# Patient Record
Sex: Female | Born: 1955 | Race: Black or African American | Hispanic: No | Smoking: Never smoker
Health system: Southern US, Community
[De-identification: ages and names within clinical notes are randomized; demographics above are authoritative.]

## PROBLEM LIST (undated history)

## (undated) DIAGNOSIS — J45909 Unspecified asthma, uncomplicated: Secondary | ICD-10-CM

## (undated) DIAGNOSIS — R159 Full incontinence of feces: Secondary | ICD-10-CM

## (undated) DIAGNOSIS — R55 Syncope and collapse: Secondary | ICD-10-CM

## (undated) DIAGNOSIS — R32 Unspecified urinary incontinence: Secondary | ICD-10-CM

## (undated) HISTORY — DX: Syncope and collapse: R55

## (undated) HISTORY — DX: Full incontinence of feces: R15.9

## (undated) HISTORY — DX: Unspecified asthma, uncomplicated: J45.909

## (undated) HISTORY — PX: TOTAL ABDOMINAL HYSTERECTOMY W/ BILATERAL SALPINGOOPHORECTOMY: SHX83

## (undated) HISTORY — PX: BREAST BIOPSY: SHX20

## (undated) HISTORY — PX: BREAST EXCISIONAL BIOPSY: SUR124

## (undated) HISTORY — DX: Unspecified urinary incontinence: R32

---

## 1999-08-31 ENCOUNTER — Encounter: Admission: RE | Admit: 1999-08-31 | Discharge: 1999-08-31 | Payer: Self-pay | Admitting: Gynecology

## 1999-08-31 ENCOUNTER — Encounter: Payer: Self-pay | Admitting: Gynecology

## 1999-09-05 ENCOUNTER — Other Ambulatory Visit: Admission: RE | Admit: 1999-09-05 | Discharge: 1999-09-05 | Payer: Self-pay | Admitting: Gynecology

## 2000-09-05 ENCOUNTER — Encounter: Payer: Self-pay | Admitting: Gynecology

## 2000-09-05 ENCOUNTER — Other Ambulatory Visit: Admission: RE | Admit: 2000-09-05 | Discharge: 2000-09-05 | Payer: Self-pay | Admitting: Gynecology

## 2000-09-05 ENCOUNTER — Encounter: Admission: RE | Admit: 2000-09-05 | Discharge: 2000-09-05 | Payer: Self-pay | Admitting: Gynecology

## 2001-09-09 ENCOUNTER — Encounter: Admission: RE | Admit: 2001-09-09 | Discharge: 2001-09-09 | Payer: Self-pay | Admitting: Gynecology

## 2001-09-09 ENCOUNTER — Encounter: Payer: Self-pay | Admitting: Gynecology

## 2001-09-20 ENCOUNTER — Other Ambulatory Visit: Admission: RE | Admit: 2001-09-20 | Discharge: 2001-09-20 | Payer: Self-pay | Admitting: Gynecology

## 2002-09-15 ENCOUNTER — Encounter: Payer: Self-pay | Admitting: Gynecology

## 2002-09-15 ENCOUNTER — Encounter: Admission: RE | Admit: 2002-09-15 | Discharge: 2002-09-15 | Payer: Self-pay | Admitting: Gynecology

## 2002-10-06 ENCOUNTER — Encounter: Payer: Self-pay | Admitting: Gynecology

## 2002-10-06 ENCOUNTER — Encounter: Admission: RE | Admit: 2002-10-06 | Discharge: 2002-10-06 | Payer: Self-pay | Admitting: Gynecology

## 2002-12-10 ENCOUNTER — Other Ambulatory Visit: Admission: RE | Admit: 2002-12-10 | Discharge: 2002-12-10 | Payer: Self-pay | Admitting: Gynecology

## 2003-09-18 ENCOUNTER — Encounter: Admission: RE | Admit: 2003-09-18 | Discharge: 2003-09-18 | Payer: Self-pay | Admitting: Gynecology

## 2004-09-13 ENCOUNTER — Ambulatory Visit: Payer: Self-pay | Admitting: Internal Medicine

## 2004-10-06 ENCOUNTER — Ambulatory Visit (HOSPITAL_COMMUNITY): Admission: RE | Admit: 2004-10-06 | Discharge: 2004-10-06 | Payer: Self-pay | Admitting: Internal Medicine

## 2005-06-05 ENCOUNTER — Ambulatory Visit: Payer: Self-pay | Admitting: Internal Medicine

## 2005-10-11 ENCOUNTER — Ambulatory Visit (HOSPITAL_COMMUNITY): Admission: RE | Admit: 2005-10-11 | Discharge: 2005-10-11 | Payer: Self-pay | Admitting: Internal Medicine

## 2006-02-01 ENCOUNTER — Encounter: Admission: RE | Admit: 2006-02-01 | Discharge: 2006-02-01 | Payer: Self-pay | Admitting: Internal Medicine

## 2006-08-02 ENCOUNTER — Ambulatory Visit: Payer: Self-pay | Admitting: Internal Medicine

## 2006-10-30 ENCOUNTER — Encounter: Admission: RE | Admit: 2006-10-30 | Discharge: 2006-10-30 | Payer: Self-pay | Admitting: Surgery

## 2007-11-01 ENCOUNTER — Encounter: Admission: RE | Admit: 2007-11-01 | Discharge: 2007-11-01 | Payer: Self-pay | Admitting: Family Medicine

## 2007-12-04 ENCOUNTER — Telehealth (INDEPENDENT_AMBULATORY_CARE_PROVIDER_SITE_OTHER): Payer: Self-pay | Admitting: *Deleted

## 2007-12-05 DIAGNOSIS — J45909 Unspecified asthma, uncomplicated: Secondary | ICD-10-CM

## 2007-12-06 ENCOUNTER — Ambulatory Visit: Payer: Self-pay | Admitting: Internal Medicine

## 2007-12-16 ENCOUNTER — Telehealth: Payer: Self-pay | Admitting: Internal Medicine

## 2008-03-16 ENCOUNTER — Encounter: Admission: RE | Admit: 2008-03-16 | Discharge: 2008-03-16 | Payer: Self-pay | Admitting: Family Medicine

## 2008-11-02 ENCOUNTER — Encounter: Admission: RE | Admit: 2008-11-02 | Discharge: 2008-11-02 | Payer: Self-pay | Admitting: Family Medicine

## 2008-11-17 ENCOUNTER — Encounter: Admission: RE | Admit: 2008-11-17 | Discharge: 2008-11-17 | Payer: Self-pay | Admitting: Family Medicine

## 2009-01-13 ENCOUNTER — Ambulatory Visit: Payer: Self-pay | Admitting: Internal Medicine

## 2009-01-13 ENCOUNTER — Encounter: Payer: Self-pay | Admitting: Internal Medicine

## 2009-12-07 ENCOUNTER — Ambulatory Visit: Payer: Self-pay | Admitting: Internal Medicine

## 2009-12-07 ENCOUNTER — Telehealth (INDEPENDENT_AMBULATORY_CARE_PROVIDER_SITE_OTHER): Payer: Self-pay | Admitting: *Deleted

## 2009-12-07 DIAGNOSIS — J069 Acute upper respiratory infection, unspecified: Secondary | ICD-10-CM | POA: Insufficient documentation

## 2009-12-08 ENCOUNTER — Telehealth (INDEPENDENT_AMBULATORY_CARE_PROVIDER_SITE_OTHER): Payer: Self-pay | Admitting: *Deleted

## 2009-12-14 ENCOUNTER — Encounter: Admission: RE | Admit: 2009-12-14 | Discharge: 2009-12-14 | Payer: Self-pay | Admitting: Family Medicine

## 2010-10-30 ENCOUNTER — Encounter: Payer: Self-pay | Admitting: Family Medicine

## 2010-10-30 ENCOUNTER — Encounter: Payer: Self-pay | Admitting: Surgery

## 2010-11-08 NOTE — Progress Notes (Signed)
Summary: prescriptions  Phone Note From Pharmacy Call back at [phone]   Caller: [facility] Call For: wert  Summary of Call: pt said Dr. Sherene Sires gave her rx for prednisone and an inhaler, pharmacy doesn't have those prescriptions. Initial call taken by: Darletta Moll,  December 07, 2009 1:12 PM  Follow-up for Phone Call        pt called back, MW did not call in her Prednisone Taper or her refill on Proair inhaler.  Would like this called in to  [facility] [address]  Pt lives in [place]  and leaves area at 5:00pm.  Need this called in asap so they will have it ready by 5. Follow-up by: Eugene Gavia,  December 07, 2009 1:25 PM  Additional Follow-up for Phone Call Additional follow up Details #1::         pt saw MW  this morning.  per pt instructions, pt was to be given a prednisone taper.  rx never called into pharmacy.  Need prednisone mg.  Per TD,  Prednisone should be 10mg .  rx sent to pharamcy.  LMOM informing pt. Arman Filter LPN  December 08, 4538 2:09 PM     New/Updated Medications: PREDNISONE 10 MG TABS (PREDNISONE) take 4 tabs by mouth x 2 days, then 2 tabs by mouth x 2 days, then 1 tab by mouth x 2 days then stop Prescriptions: PROAIR HFA 108 (90 BASE) MCG/ACT  AERS (ALBUTEROL SULFATE) 1-2 puffs every 4-6 hours as needed  #1 x 2   Entered by:   Arman Filter LPN   Authorized by:   Nyoka Cowden MD   Signed by:   Arman Filter LPN on 98/08/9146   Method used:   Electronically to        [facility] [address] [phone]* (retail)       [address]       [place], [place]  [address]       Ph: [phone] or [phone]       Fax: [phone]   RxID:   [id number] PREDNISONE 10 MG TABS (PREDNISONE) take 4 tabs by mouth x 2 days, then 2 tabs by mouth x 2 days, then 1 tab by mouth x 2 days then stop  #14 x 0   Entered by:   Arman Filter LPN   Authorized by:   Nyoka Cowden MD   Signed by:   Arman Filter LPN on 43/32/9518   Method used:   Electronically to          [facility] [address] [phone]* (retail)       [address]       [place], [place]  [address]       Ph: [phone] or [phone]       Fax: [phone]   RxID:   [id number]

## 2010-11-08 NOTE — Assessment & Plan Note (Signed)
Summary: Pulmononary/ uri ? sinusitis ? early bronchopna   Primary Provider/Referring Provider:  [facility]  CC:  Acute viist.  Pt states that she has been sick since Christmas 2010.  She c/o cough and congestion- cough prod with green to clear sputum.  She has also and fever/chills and weakness.  Flu and strep were both neg per her PCP.  Katie Colon  History of Present Illness: 55 yowbf  never smoker characterized as mild to moderate chronic asthma associated with chronic rhinitis but remained completely asymptomatic for  on the combination of Singulair one daily and Nasarel previously.  3/09 insurance requested substitute nasonex.  Caused nose bleeding w/in one week so d/c'd.  January 13, 2009 ov patient expressed her frustration that she was not offered samples rather than a prescription that she had to pay for 3 full months that she could not use but has actually done fairly well over the last year on Singulair as monotherapy with no significant flares of either asthma rhinitis or sinus disease. She does notice shortness of breath on exertion and occasional cough at night but says she has learned to live with it.  rec no change in rx.  Before December 2010 HA then dry throat/ coughing up green fever chills weakness self rx with ibuprofen, gradually better, then worse then to PCP dx with uri rx mucinex better except no energy and still with thick green mucus then acutely worse 2/25 with same problems with generalized cp ant with coughing.   Pt denies any significant sore throat, dysphagia, itching, sneezing,  shaking chills, sweats, unintended wt loss, pleuritic or exertional cp, hempoptysis, change in activity tolerance orthopnea pnd or leg swelling.  Pt also denies any obvious fluctuation in symptoms with weather or environmental change or other alleviating or aggravating factors.       Current Medications (verified): 1)  Singulair 10 Mg  Tabs (Montelukast Sodium) .... Once Daily in Evening 2)   Femring 0.1 Mg/24hr  Ring (Estradiol Acetate) .... As Directed 3)  Proair Hfa 108 (90 Base) Mcg/act  Aers (Albuterol Sulfate) .Katie Colon.. 1-2 Puffs Every 4-6 Hours As Needed 4)  Calcium 600 1500 Mg Tabs (Calcium Carbonate) .... 2 Once Daily  Allergies (verified): 1)  ! Codeine  Past History:  Past Medical History: Chronic Asthma/rhinitis    Vital Signs:  Patient profile:   55 year old female Weight:      168 pounds BMI:     24.90 O2 Sat:      97 % on Room air Temp:     99.0 degrees F oral Pulse rate:   79 / minute BP sitting:   140 / 86  (left arm)  Vitals Entered By: Vernie Murders (December 07, 2009 9:58 AM)  O2 Flow:  Room air  Physical Exam  Additional Exam:  Ambulatory healthy appearing in no acute distress.  wt 171 > 168 December 07, 2009  HEENT: nl dentition, turbinates, and orophanx. Nl external ear canals without cough reflex Neck without JVD/Nodes/TM Lungs clear to A and P bilaterally without cough on insp or exp maneuvers, a few squeaks in LLL RRR no s3 or murmur or increase in P2 Abd soft and benign with nl excursion in the supine position. No bruits or organomegaly Ext warm without calf tenderness, cyanosis clubbing or edema Skin warm and dry without lesions     Impression & Recommendations:  Problem # 1:  ASTHMA, MILD (ICD-493.90) adequate control despite obvious uri ? rhinitis/sinusitis related,  so no changes needed.  Problem # 2:  URI, ACUTE (ICD-465.9)  c/w acute rhintis/sinusits and present for weeks now with a few squeaks in the LLL but no def as dz yet, will rx with 5-10 days of levaquin empirically with f/u sinus ct if not 100%   Each maintenance medication was reviewed in detail including most importantly the difference between maintenance and as needed and under what circumstances the prns are to be used. See instructions for specific recommendations   Orders: Est. Patient Level IV (13244)  Medications Added to Medication List This Visit: 1)  Calcium  600 1500 Mg Tabs (Calcium carbonate) .... 2 once daily 2)  Levaquin 750 Mg Tabs (Levofloxacin) .... One tablet by mouth daily  Other Orders: T-2 View CXR (71020TC)  Patient Instructions: 1)  Levaquin x 5 days repeat if not 100%  2)  Prednisone 4 each am x 2days, 2x2days, 1x2days and stop 3)  Mucinex dm 1-2 every 12 hours 4)  Saline for nose problems 5)  GERD (REFLUX)  is a common cause of respiratory symptoms. It commonly presents without heartburn and can be treated with medication, but also with lifestyle changes including avoidance of late meals, excessive alcohol, smoking cessation, and avoid fatty foods, chocolate, peppermint, colas, red wine, and acidic juices such as orange juice. NO MINT OR MENTHOL PRODUCTS SO NO COUGH DROPS  6)  USE SUGARLESS CANDY INSTEAD (jolley ranchers)  7)  NO OIL BASED VITAMINS  8)  Pepcid 20 mg one at bedtime as long as coughing 9)  Return if not 100% Prescriptions: LEVAQUIN 750 MG  TABS (LEVOFLOXACIN) One tablet by mouth daily  #5 x 1   Entered and Authorized by:   Nyoka Cowden MD   Signed by:   Nyoka Cowden MD on 12/07/2009   Method used:   Electronically to        [facility] [address] [phone]* (retail)       [address]       [place], [place]  [address]       Ph: [phone] or [phone]       Fax: [phone]   RxID:   [id number]

## 2010-11-08 NOTE — Progress Notes (Signed)
Summary: returning call   Phone Note Call from Patient Call back at [phone]   Caller: Patient Call For: wert Summary of Call: patient is returning call. she didn't remember who it was that called her. but she thinks it was regarding her xrays she had yesterday. Initial call taken by: Valinda Hoar,  December 08, 2009 12:48 PM  Follow-up for Phone Call        per 12-07-09 append to cxr: "Reviewed actual cxr on imagecast  - call report to pt, cxr ok, no change in recs. Thanks"  ATC pt at number provided, was told by operator # is incorrect.  LMOM at pt's home number TCB. Boone Master CNA  December 08, 2009 2:40 PM   Additional Follow-up for Phone Call Additional follow up Details #1::        LMOMTCB at the [phone] number Vernie Murders  December 08, 2009 4:48 PM  pt returned call.  advised of cxr results as stated by MW on 12-07-09 append.  pt verbalized her understanding. Boone Master CNA  December 08, 2009 5:04 PM

## 2010-11-23 ENCOUNTER — Other Ambulatory Visit: Payer: Self-pay | Admitting: Internal Medicine

## 2010-11-23 DIAGNOSIS — Z1231 Encounter for screening mammogram for malignant neoplasm of breast: Secondary | ICD-10-CM

## 2010-12-19 ENCOUNTER — Ambulatory Visit: Payer: Self-pay

## 2010-12-20 ENCOUNTER — Ambulatory Visit
Admission: RE | Admit: 2010-12-20 | Discharge: 2010-12-20 | Disposition: A | Discharge status: Home / Self Care | Payer: BC Managed Care – PPO | Source: Ambulatory Visit | Attending: Internal Medicine | Admitting: Internal Medicine

## 2010-12-20 DIAGNOSIS — Z1231 Encounter for screening mammogram for malignant neoplasm of breast: Secondary | ICD-10-CM

## 2010-12-31 IMAGING — CR DG CHEST 2V
2 series · 2 of 2 positions shown · non-contrast
Comparison: Digitized [REDACTED] chest x-ray 12/02/1998.

CLINICAL DATA: Cough and chest discomfort, asthma, nonsmoker.

CHEST - 2 VIEW

[view not recorded (1 of 2)]
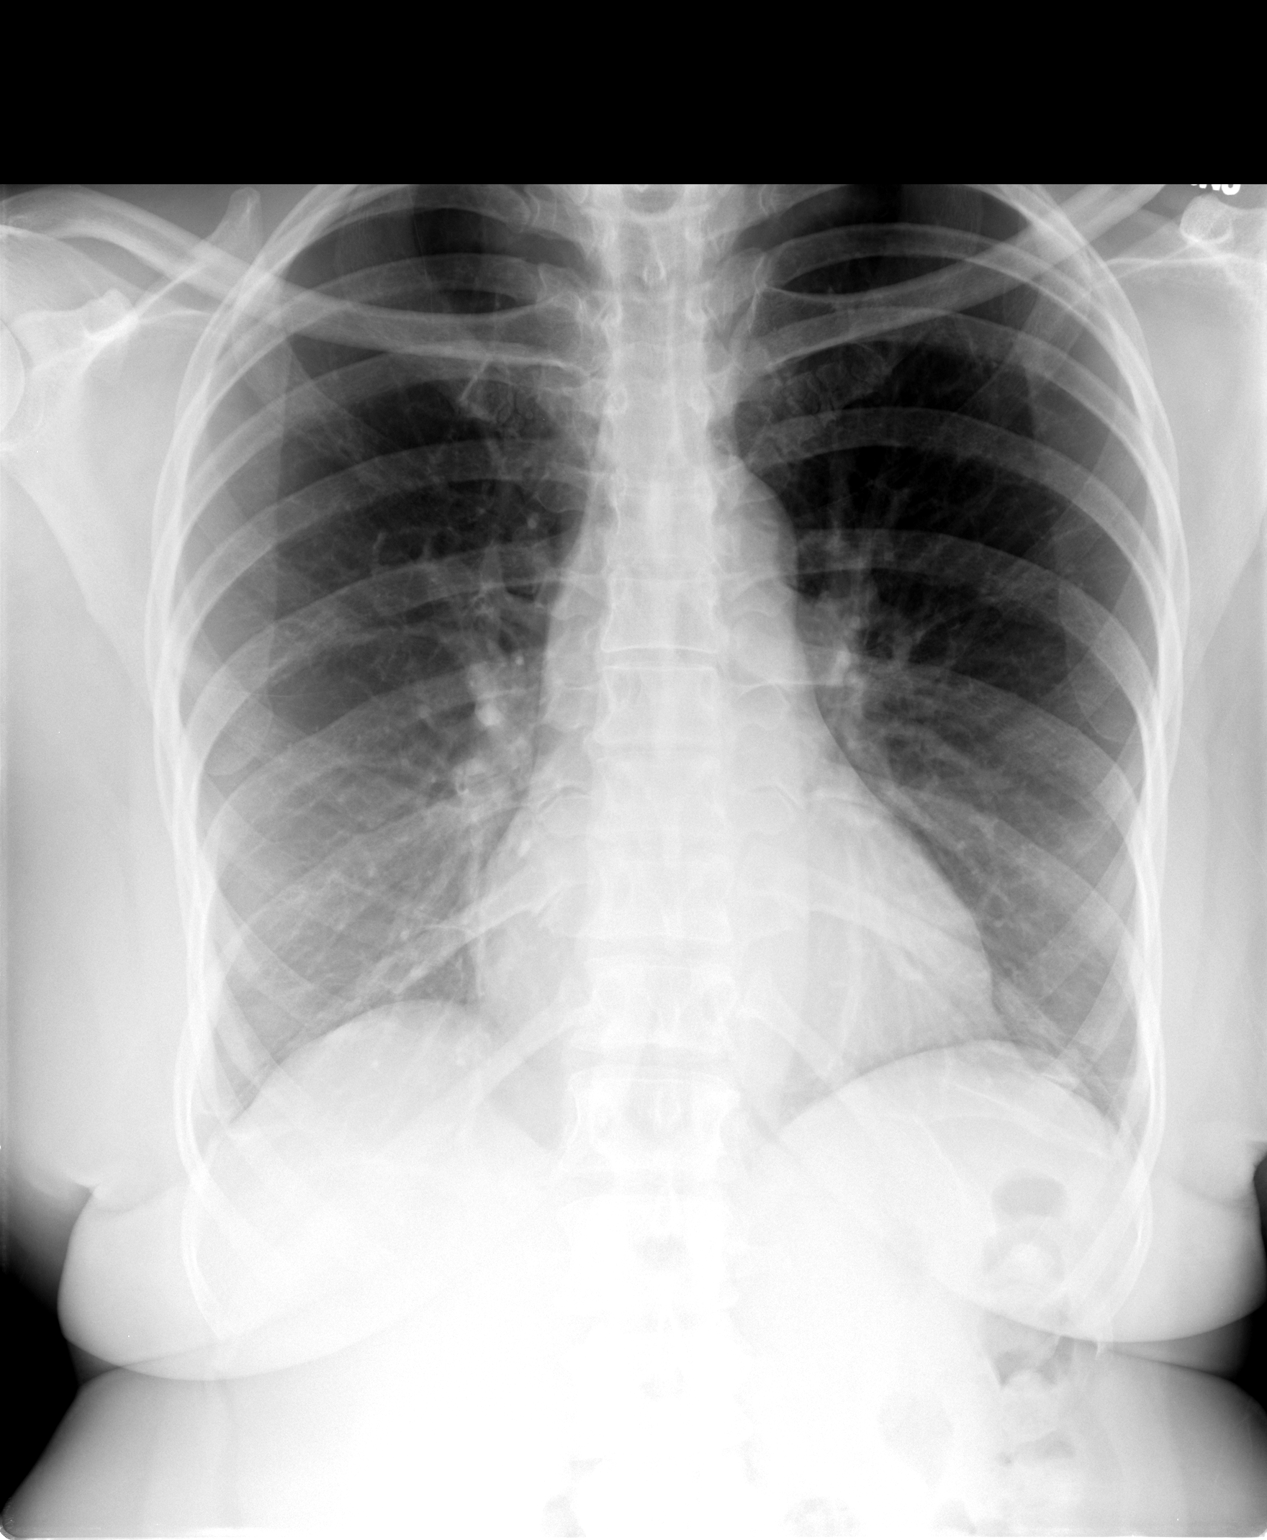

[view not recorded (2 of 2)]
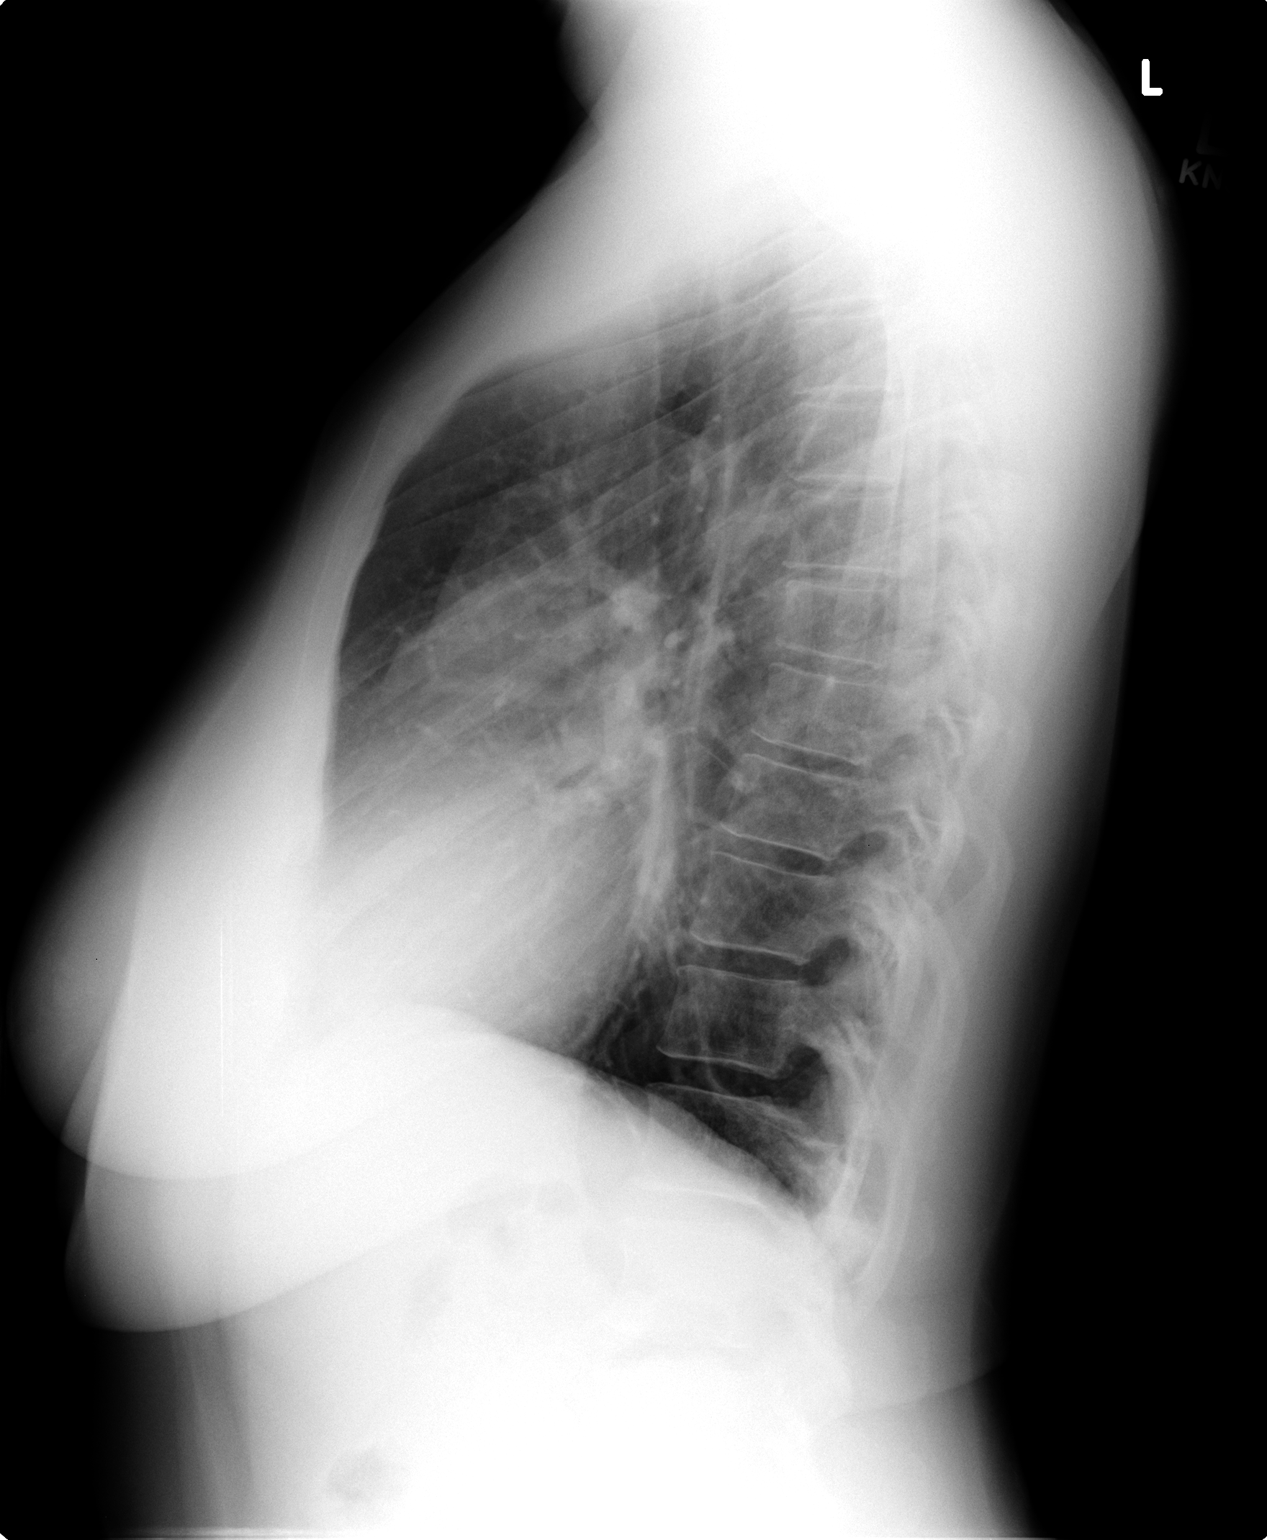

[2 of 2 positions shown; findings below may reference images not displayed]

FINDINGS: Lungs remain clear.  Interval slight cardiomegaly
visualized.  Mediastinum, hila, pleura and minimal dextrorotary
scoliosis thoracolumbar spine junction are otherwise unremarkable.
IMPRESSION: 1.  Interval slight cardiomegaly.
2.  No active cardiopulmonary disease.

## 2011-02-24 NOTE — Assessment & Plan Note (Signed)
[facility]                               PULMONARY OFFICE NOTE   ANNALINA, NEEDLES                   MRN:          [id number]  DATE:08/02/2006                            DOB:          [birth year]    HISTORY:  A 55 year old black female with a history of mild chronic asthma,  maintained on a combination of Singulair and Nasarel that she adjusts from 1  to 2 puffs daily, up to 1 to 2 puffs b.i.d.  She comes in with increasing  symptoms of cough and hoarseness over the last week with slightly  discolored, yellowish sputum.  She complains of dyspnea only when coughing.  Overall, until a week ago, she had no respiratory complaints.   MEDICATIONS:  For a complete inventory of her medications, please see the  face sheet column dated August 02, 2006, it is correct as listed.   PHYSICAL EXAMINATION:  She is a pleasant, ambulatory black female, who does  not appear acutely ill or toxic.  She is afebrile, stable vital signs.  HEENT:  Unremarkable, oropharynx is clear.  LUNGS:  Lung fields are clear bilaterally to auscultation and percussion.  There is a regular rhythm without murmur, gallop or rub.  ABDOMEN:  Soft, benign.  EXTREMITIES:  Warm without calf tenderness, cyanosis, clubbing or edema.   IMPRESSION:  1. Excellent control of asthma on combination therapy with Singulair and      Nasarel.  2. Acute flare-up of rhinitis, is probably nonspecific but could represent      an early bacterial infection.  I recommended a 7-day course of      doxycycline and a 6-day course of prednisone if the symptoms worsen or      fail to resolve after 5 days of conservative therapy with nasal saline,      Afrin, Advil Cold and Sinus per her previous instructions, for      treatment of rhinitis, which I have reviewed with her in detail.      Followup will be on a yearly basis, sooner if needed.    ______________________________  Charlaine Dalton Sherene Sires, MD, [facility]    MBW/MedQ  DD: 08/02/2006  DT: 08/04/2006  Job #: [id number]

## 2011-11-21 ENCOUNTER — Other Ambulatory Visit: Payer: Self-pay | Admitting: Family Medicine

## 2011-11-21 DIAGNOSIS — Z1231 Encounter for screening mammogram for malignant neoplasm of breast: Secondary | ICD-10-CM

## 2011-12-22 ENCOUNTER — Ambulatory Visit
Admission: RE | Admit: 2011-12-22 | Discharge: 2011-12-22 | Disposition: A | Discharge status: Home / Self Care | Payer: BC Managed Care – PPO | Source: Ambulatory Visit | Attending: Family Medicine | Admitting: Family Medicine

## 2011-12-22 DIAGNOSIS — Z1231 Encounter for screening mammogram for malignant neoplasm of breast: Secondary | ICD-10-CM

## 2011-12-27 ENCOUNTER — Other Ambulatory Visit: Payer: Self-pay | Admitting: Family Medicine

## 2011-12-27 DIAGNOSIS — R928 Other abnormal and inconclusive findings on diagnostic imaging of breast: Secondary | ICD-10-CM

## 2012-02-23 ENCOUNTER — Ambulatory Visit
Admission: RE | Admit: 2012-02-23 | Discharge: 2012-02-23 | Disposition: A | Discharge status: Home / Self Care | Payer: BC Managed Care – PPO | Source: Ambulatory Visit | Attending: Family Medicine | Admitting: Family Medicine

## 2012-02-23 DIAGNOSIS — R928 Other abnormal and inconclusive findings on diagnostic imaging of breast: Secondary | ICD-10-CM

## 2013-01-15 ENCOUNTER — Other Ambulatory Visit: Payer: Self-pay

## 2013-01-15 DIAGNOSIS — Z1231 Encounter for screening mammogram for malignant neoplasm of breast: Secondary | ICD-10-CM

## 2013-03-07 ENCOUNTER — Ambulatory Visit
Admission: RE | Admit: 2013-03-07 | Discharge: 2013-03-07 | Disposition: A | Discharge status: Home / Self Care | Payer: BC Managed Care – PPO | Source: Ambulatory Visit

## 2013-03-07 DIAGNOSIS — Z1231 Encounter for screening mammogram for malignant neoplasm of breast: Secondary | ICD-10-CM

## 2014-02-11 ENCOUNTER — Other Ambulatory Visit: Payer: Self-pay

## 2014-02-11 DIAGNOSIS — Z1231 Encounter for screening mammogram for malignant neoplasm of breast: Secondary | ICD-10-CM

## 2014-03-09 ENCOUNTER — Ambulatory Visit
Admission: RE | Admit: 2014-03-09 | Discharge: 2014-03-09 | Disposition: A | Discharge status: Home / Self Care | Payer: BC Managed Care – PPO | Source: Ambulatory Visit

## 2014-03-09 ENCOUNTER — Encounter (INDEPENDENT_AMBULATORY_CARE_PROVIDER_SITE_OTHER): Payer: Self-pay

## 2014-03-09 DIAGNOSIS — Z1231 Encounter for screening mammogram for malignant neoplasm of breast: Secondary | ICD-10-CM

## 2015-02-12 ENCOUNTER — Other Ambulatory Visit: Payer: Self-pay

## 2015-02-12 DIAGNOSIS — Z1231 Encounter for screening mammogram for malignant neoplasm of breast: Secondary | ICD-10-CM

## 2015-03-11 ENCOUNTER — Ambulatory Visit
Admission: RE | Admit: 2015-03-11 | Discharge: 2015-03-11 | Disposition: A | Discharge status: Home / Self Care | Payer: BLUE CROSS/BLUE SHIELD | Source: Ambulatory Visit

## 2015-03-11 ENCOUNTER — Encounter (INDEPENDENT_AMBULATORY_CARE_PROVIDER_SITE_OTHER): Payer: Self-pay

## 2015-03-11 DIAGNOSIS — Z1231 Encounter for screening mammogram for malignant neoplasm of breast: Secondary | ICD-10-CM

## 2015-03-15 ENCOUNTER — Other Ambulatory Visit: Payer: Self-pay | Admitting: Family Medicine

## 2015-03-15 DIAGNOSIS — R928 Other abnormal and inconclusive findings on diagnostic imaging of breast: Secondary | ICD-10-CM

## 2015-04-16 ENCOUNTER — Ambulatory Visit
Admission: RE | Admit: 2015-04-16 | Discharge: 2015-04-16 | Disposition: A | Discharge status: Home / Self Care | Payer: BLUE CROSS/BLUE SHIELD | Source: Ambulatory Visit | Attending: Family Medicine | Admitting: Family Medicine

## 2015-04-16 DIAGNOSIS — R928 Other abnormal and inconclusive findings on diagnostic imaging of breast: Secondary | ICD-10-CM

## 2015-07-30 ENCOUNTER — Ambulatory Visit (INDEPENDENT_AMBULATORY_CARE_PROVIDER_SITE_OTHER): Payer: BLUE CROSS/BLUE SHIELD | Admitting: Neurology

## 2015-07-30 ENCOUNTER — Encounter: Payer: Self-pay | Admitting: Neurology

## 2015-07-30 VITALS — BP 136/77 | HR 75 | Ht 68.0 in | Wt 171.5 lb

## 2015-07-30 DIAGNOSIS — R55 Syncope and collapse: Secondary | ICD-10-CM | POA: Diagnosis not present

## 2015-07-30 DIAGNOSIS — H8309 Labyrinthitis, unspecified ear: Secondary | ICD-10-CM

## 2015-07-30 NOTE — Progress Notes (Addendum)
Reason for visit: Vertigo  Referring physician: Dr. Shan LevansFulp  Katie Colon is a 59 y.o. female  History of present illness:  Katie Colon is a 59 year old right-handed black female with a history of onset of severe vertigo that began on 07/16/2015. The patient woke up at 3 AM with severe vertigo, and nausea. The patient went to the bathroom and had episodes of vomiting. The patient is unsure what happened at that point, she may have lost consciousness, but she woke up around 5:30 in the morning on the floor in the bathroom. The patient was able to get back towards the bed, but once again either went to sleep or passed out, and woke up several hours later on the bedroom floor. The patient noted a slight discomfort in the head, no overt headache. She denied any focal numbness or weakness of the face, arms, or legs. She denied any double vision, loss of vision, or slurred speech. The patient had severe vertigo for 3 or 4 days, she did go to the emergency [place] in [place], [place]. The patient underwent a CT scan of the head and an EKG. She was placed on meclizine and diazepam, this seemed to help her symptoms, but made her drowsy. The patient was able to return to work 4 days later, and since that time she has had gradual improvement of vertigo with only occasional episodes of dizziness when she moves her head quickly. She does have some mild gait instability, but no falls. She has reported no hearing changes, ear pain, or ringing in the ears. She has never had episodes of vertigo previously. She is sent to this office for an evaluation. She does report a viral type illness 2 or 3 weeks prior to the onset of symptoms associated with a low-grade fever.  Past Medical History  Diagnosis Date  . Asthma   . Urinary incontinence   . Fecal incontinence   . Syncope and collapse     Past Surgical History  Procedure Laterality Date  . Total abdominal hysterectomy w/ bilateral salpingoophorectomy     . Breast lumpectomy      Family History  Problem Relation Age of Onset  . Alzheimer's disease Mother   . Hypertension Mother   . Hypertension Father   . CAD Father   . Diabetes Father   . Transient ischemic attack Father   . Hypertension Sister   . Hypertension Brother   . Sleep apnea Brother   . Anemia Brother   . Diabetes Paternal Aunt     Social history:  reports that she has never smoked. She does not have any smokeless tobacco history on file. She reports that she does not drink alcohol. Her drug history is not on file.  Medications:  Prior to Admission medications   Medication Sig Start Date End Date Taking? Authorizing Provider  albuterol (PROVENTIL HFA;VENTOLIN HFA) 108 (90 BASE) MCG/ACT inhaler Inhale 1 puff into the lungs every 6 (six) hours as needed for wheezing or shortness of breath.   Yes Historical Provider, MD  Biotin 10 MG TABS Take 1 tablet by mouth 2 (two) times daily.    Yes Historical Provider, MD  Calcium Carbonate-Vit D-Min (CALCIUM/VITAMIN D/MINERALS) 600-200 MG-UNIT TABS Take 1 tablet by mouth 2 (two) times daily.    Yes Historical Provider, MD  diazepam (VALIUM) 2 MG tablet Take 2 mg by mouth every 8 (eight) hours as needed for anxiety.   Yes Historical Provider, MD  Estradiol Acetate 0.05 MG/24HR RING  Place 1 each vaginally every 3 (three) months.   Yes Historical Provider, MD  meclizine (ANTIVERT) 25 MG tablet Take 25 mg by mouth 3 (three) times daily as needed for dizziness.   Yes Historical Provider, MD  montelukast (SINGULAIR) 10 MG tablet Take 10 mg by mouth at bedtime.   Yes Historical Provider, MD      Allergies  Allergen Reactions  . Codeine     ROS:  Out of a complete 14 system review of symptoms, the patient complains only of the following symptoms, and all other reviewed systems are negative.  Dizziness  Blood pressure 136/77, pulse 75, height  (1.727 m), weight 171 lb 8 oz (77.792 kg).  Physical Exam  General: The patient  is alert and cooperative at the time of the examination.  Eyes: Pupils are equal, round, and reactive to light. Discs are flat bilaterally.  Neck: The neck is supple, no carotid bruits are noted.  Respiratory: The respiratory examination is clear.  Cardiovascular: The cardiovascular examination reveals a regular rate and rhythm, no obvious murmurs or rubs are noted.  Skin: Extremities are without significant edema.  Neurologic Exam  Mental status: The patient is alert and oriented x 3 at the time of the examination. The patient has apparent normal recent and remote memory, with an apparently normal attention span and concentration ability.  Cranial nerves: Facial symmetry is present. There is good sensation of the face to pinprick and soft touch bilaterally. The strength of the facial muscles and the muscles to head turning and shoulder shrug are normal bilaterally. Speech is well enunciated, no aphasia or dysarthria is noted. Extraocular movements are full. Visual fields are full. The tongue is midline, and the patient has symmetric elevation of the soft palate. No obvious hearing deficits are noted.  Motor: The motor testing reveals 5 over 5 strength of all 4 extremities. Good symmetric motor tone is noted throughout.  Sensory: Sensory testing is intact to pinprick, soft touch, vibration sensation, and position sense on all 4 extremities. No evidence of extinction is noted.  Coordination: Cerebellar testing reveals good finger-nose-finger and heel-to-shin bilaterally.  Gait and station: Gait is normal. Tandem gait is normal. Romberg is negative. No drift is seen.  Reflexes: Deep tendon reflexes are symmetric, but are depressed bilaterally. Toes are downgoing bilaterally.   Assessment/Plan:  1. Probable viral vestibulitis  There is some question whether or not the patient may have had episodes of syncope or whether she simply went to sleep. The clinical event otherwise is otherwise  fully consistent with vestibulitis, the patient is already improving. Given the question of syncope, the patient will be set up for MRI evaluation of the brain, the CT scan evaluation is not adequate to evaluate the brainstem area. If the study is unremarkable, no further evaluation is required. The patient will follow-up through this office on an as-needed basis.  Marlan Palau MD 07/30/2015 2:17 PM  [facility] [address] [place], [place] [address]  Phone [phone] Fax [phone]

## 2015-08-25 ENCOUNTER — Ambulatory Visit (INDEPENDENT_AMBULATORY_CARE_PROVIDER_SITE_OTHER): Payer: BLUE CROSS/BLUE SHIELD

## 2015-08-25 DIAGNOSIS — H8309 Labyrinthitis, unspecified ear: Secondary | ICD-10-CM | POA: Diagnosis not present

## 2015-08-25 DIAGNOSIS — R55 Syncope and collapse: Secondary | ICD-10-CM

## 2015-08-27 ENCOUNTER — Telehealth: Payer: Self-pay | Admitting: Neurology

## 2015-08-27 NOTE — Telephone Encounter (Signed)
I called patient. MRI the brain shows minimal white matter changes. No evidence of cerebellar or brainstem lesions. The patient is still having occasional episodes of brief vertigo when she rolls over in bed or moves her head suddenly. The patient is to watch this for now, if this persists over the next 6-8 weeks, she will call me back, we will consider vestibular rehabilitation for this.   MRI brain 08/26/15:  IMPRESSION:  Mildly abnormal MRI brain (without) demonstrating: 1. Few scattered periventricular and subcortical foci of non-specific gliosis. These findings are non-specific and considerations include autoimmune, inflammatory, post-infectious, microvascular ischemic or migraine associated etiologies.  2. No acute findings.

## 2016-05-05 ENCOUNTER — Other Ambulatory Visit: Payer: Self-pay | Admitting: Family Medicine

## 2016-05-05 DIAGNOSIS — Z1231 Encounter for screening mammogram for malignant neoplasm of breast: Secondary | ICD-10-CM

## 2016-05-09 ENCOUNTER — Ambulatory Visit
Admission: RE | Admit: 2016-05-09 | Discharge: 2016-05-09 | Disposition: A | Discharge status: Home / Self Care | Payer: BLUE CROSS/BLUE SHIELD | Source: Ambulatory Visit | Attending: Family Medicine | Admitting: Family Medicine

## 2016-05-09 DIAGNOSIS — Z1231 Encounter for screening mammogram for malignant neoplasm of breast: Secondary | ICD-10-CM

## 2016-05-16 ENCOUNTER — Telehealth: Payer: Self-pay | Admitting: Neurology

## 2016-05-16 NOTE — Telephone Encounter (Signed)
Pt sent an email that went to Angie, she forwarded it me. This pt wants an appt on 8/11 in the afternoon to address Hep C screening and Tetanus/Tdap. I called the pt but she said she was in a Dr's office and could she call back. I advised she could. I was not able to ask any questions.

## 2016-06-30 ENCOUNTER — Ambulatory Visit: Payer: BLUE CROSS/BLUE SHIELD

## 2017-03-14 ENCOUNTER — Encounter: Payer: Self-pay | Admitting: Internal Medicine

## 2017-03-14 ENCOUNTER — Ambulatory Visit (INDEPENDENT_AMBULATORY_CARE_PROVIDER_SITE_OTHER): Payer: BLUE CROSS/BLUE SHIELD | Admitting: Internal Medicine

## 2017-03-14 VITALS — BP 141/58 | HR 60 | Temp 98.2°F | Ht 68.0 in | Wt 179.7 lb

## 2017-03-14 DIAGNOSIS — J4520 Mild intermittent asthma, uncomplicated: Secondary | ICD-10-CM | POA: Diagnosis not present

## 2017-03-14 DIAGNOSIS — R030 Elevated blood-pressure reading, without diagnosis of hypertension: Secondary | ICD-10-CM

## 2017-03-14 DIAGNOSIS — R7303 Prediabetes: Secondary | ICD-10-CM | POA: Insufficient documentation

## 2017-03-14 LAB — POCT GLYCOSYLATED HEMOGLOBIN (HGB A1C): Hemoglobin A1C: 5.8

## 2017-03-14 LAB — GLUCOSE, CAPILLARY: Glucose-Capillary: 82 mg/dL (ref 65–99)

## 2017-03-14 MED ORDER — MONTELUKAST SODIUM 10 MG PO TABS: 10.0000 mg | ORAL_TABLET | Freq: Every day | ORAL | 1 refills | Status: DC

## 2017-03-14 MED ORDER — ALBUTEROL SULFATE HFA 108 (90 BASE) MCG/ACT IN AERS: 2.0000 | INHALATION_SPRAY | Freq: Four times a day (QID) | RESPIRATORY_TRACT | 3 refills | Status: AC | PRN

## 2017-03-14 NOTE — Progress Notes (Signed)
Release of Information form signed - given to Chilon.

## 2017-03-14 NOTE — Patient Instructions (Signed)
Ms. Katie Colon it was a pleasure meeting you today.  -Continue taking Singulair  -Use albuterol inhaler as needed for wheezing or shortness of breath  -Return to the clinic in 3-6 months for a follow-up

## 2017-03-15 NOTE — Progress Notes (Signed)
Internal Medicine Clinic Attending  Case discussed with Dr. Rathoreat the time of the visit. We reviewed the resident's history and exam and pertinent patient test results. I agree with the assessment, diagnosis, and plan of care documented in the resident's note.  

## 2017-03-15 NOTE — Assessment & Plan Note (Signed)
Assessment Patient does not have a history of hypertension. Blood pressure 141/58 at this visit.  Plan -Continue to monitor

## 2017-03-15 NOTE — Assessment & Plan Note (Signed)
History of present illness Patient reports having asthma since childhood. States her symptoms were worse in her 1620s and she was hospitalized at least once a year for an asthma exacerbation at that time. Never been intubated. At present, she reports feeling well and has no complaints. States she has not required her albuterol inhaler in the past 2 years and has had not had any asthma exacerbations the past 10 years. States she was seen by pulmonology many years ago when she had worsening symptoms and has been taking Singulair since then. Denies having any history of allergies.  Assessment Mild intermittent asthma. Her symptoms are very well controlled at present. Per pulmonology note, PFTs done in 2006 with FEV1 77%, ratio 61%, DLCO 96%, and 9% response to bronchodilators.  Plan -Albuterol when necessary -I explained to the patient that since her asthma is very well controlled, she might need a not need Singulair anymore. Patient was apprehensive and did not want to stop this medication at this time. As such, will continue Singulair. -Consider ordering repeat PFTs in the future

## 2017-03-15 NOTE — Assessment & Plan Note (Signed)
Assessment Patient requested an A1c check at this visit. Stated she has gained about 30 pounds in the past 2 years and is now working toward losing that weight. Denies having polydipsia, polyphagia, or polyuria. A1c checked at this visit 5.8 and CBG 82.  Plan -Educated patient about healthy eating and exercise. Emphasized the importance of weight loss.  -Repeat A1c in one year

## 2017-03-15 NOTE — Progress Notes (Signed)
   CC: Patient is new to the clinic and here to establish care. Asthma and prediabetes were discussed during this visit.  HPI:  [name] is a 61 y.o. female with a past medical history of conditions listed below presenting to the clinic as a new patient and is here to establish care. Asthma and prediabetes were discussed during this visit. Please see problem based charting for the status of the patient's current and chronic medical conditions.   Past Medical History:  Diagnosis Date  . Asthma   . Fecal incontinence   . Syncope and collapse   . Urinary incontinence    Past surgical history: Lumpectomies 5 years ago Tubal ligation in early 20s Total abdominal hysterectomy in mid 20s  Medications: Singulair Calcium supplement  Allergies: Codeine  Social history: Never smoker No ethanol use No illicit drug use  Family history:  Hypertension - mother, father, siblings Diabetes - father Heart disease - father Alzheimer's disease - mother Blood disorder - brother Mother died at age 62. Brother died at age 62. Father is living.  Review of Systems:  Review of Systems  Constitutional: Negative for chills and fever.  Respiratory: Negative for shortness of breath and wheezing.   Cardiovascular: Negative for chest pain and leg swelling.  Gastrointestinal: Negative for abdominal pain, nausea and vomiting.  Skin: Negative for itching and rash.    Physical Exam:  Vitals:   03/14/17 1545  BP: (!) 141/58  Pulse: 60  Temp: 98.2 F (36.8 C)  TempSrc: Oral  SpO2: 100%  Weight: 179 lb 11.2 oz (81.5 kg)  Height: 5\' 8"  (1.727 m)   Physical Exam  Constitutional: She is oriented to person, place, and time. She appears well-developed and well-nourished. No distress.  HENT:  Head: Normocephalic and atraumatic.  Mouth/Throat: Oropharynx is clear and moist.  Eyes: Pupils are equal, round, and reactive to light. Right eye exhibits no discharge. Left eye exhibits no  discharge.  Neck: Neck supple. No tracheal deviation present.  Cardiovascular: Normal rate, regular rhythm and intact distal pulses.   Pulmonary/Chest: Effort normal and breath sounds normal. No respiratory distress. She has no wheezes. She has no rales.  Abdominal: Soft. Bowel sounds are normal. She exhibits no distension. There is no tenderness.  Musculoskeletal: She exhibits no edema.  Neurological: She is alert and oriented to person, place, and time.  Skin: Skin is warm and dry.    Assessment & Plan:   See Encounters Tab for problem based charting.  Patient discussed with Dr. Rogelia BogaButcher

## 2017-03-26 ENCOUNTER — Encounter: Payer: Self-pay | Admitting: Internal Medicine

## 2017-04-24 ENCOUNTER — Other Ambulatory Visit: Payer: Self-pay | Admitting: Family Medicine

## 2017-04-24 DIAGNOSIS — Z1231 Encounter for screening mammogram for malignant neoplasm of breast: Secondary | ICD-10-CM

## 2017-05-10 ENCOUNTER — Ambulatory Visit
Admission: RE | Admit: 2017-05-10 | Discharge: 2017-05-10 | Disposition: A | Discharge status: Home / Self Care | Payer: BLUE CROSS/BLUE SHIELD | Source: Ambulatory Visit | Attending: Family Medicine | Admitting: Family Medicine

## 2017-05-10 DIAGNOSIS — Z1231 Encounter for screening mammogram for malignant neoplasm of breast: Secondary | ICD-10-CM

## 2017-05-22 ENCOUNTER — Encounter: Payer: Self-pay | Admitting: Internal Medicine

## 2017-05-22 ENCOUNTER — Ambulatory Visit (INDEPENDENT_AMBULATORY_CARE_PROVIDER_SITE_OTHER): Payer: BLUE CROSS/BLUE SHIELD | Admitting: Internal Medicine

## 2017-05-22 VITALS — BP 144/59 | HR 56 | Temp 97.7°F | Ht 68.0 in | Wt 170.4 lb

## 2017-05-22 DIAGNOSIS — Z0000 Encounter for general adult medical examination without abnormal findings: Secondary | ICD-10-CM | POA: Insufficient documentation

## 2017-05-22 DIAGNOSIS — Z0289 Encounter for other administrative examinations: Secondary | ICD-10-CM

## 2017-05-22 DIAGNOSIS — R030 Elevated blood-pressure reading, without diagnosis of hypertension: Secondary | ICD-10-CM | POA: Diagnosis not present

## 2017-05-22 NOTE — Patient Instructions (Addendum)
Thank you for coming in today and I apologize for the confusion with her paperwork. Ms. Katie Colon gave you a fax number and contact the clinic so we can get all results sent to the proper place for your insurance.   Your blood pressure was a little elevated again today, but that should hopefully improve as you continue to do well with exercising and eating healthy. Like we mentioned, this will also prevent pre-diabetes from progressing.   For your next visit in three months we will get you changed to another PCP.

## 2017-05-22 NOTE — Progress Notes (Signed)
   CC: Lab work  HPI:  [name] is a 61 y.o. F with past medical history as detailed below who presents to the clinic to obtain lab work.   She wishes to obtain routine testing and send the results to her insurance company for an incentive. She e-mailed a form to be included, however, a sheet is missing in the attached file.   She also has recently established care here, however, she is requesting a female provider as her primary care physician. The staff was notified of this desire and appropriate changes will be made on follow up.   Past Medical History:  Diagnosis Date  . Asthma   . Fecal incontinence   . Syncope and collapse   . Urinary incontinence    Review of Systems:  Review of Systems  Constitutional: Negative for chills and fever.  Eyes: Negative for blurred vision and double vision.  Respiratory: Negative for shortness of breath and wheezing.   Cardiovascular: Negative for chest pain.  Neurological: Negative for headaches.    Physical Exam:  Vitals:   05/22/17 1555  BP: (!) 144/59  Pulse: (!) 56  Temp: 97.7 F (36.5 C)  TempSrc: Oral  SpO2: 100%  Weight: 170 lb 6.4 oz (77.3 kg)  Height: 5\' 8"  (1.727 m)   Physical Exam  Constitutional: She is oriented to person, place, and time. She appears well-developed and well-nourished.  HENT:  Mouth/Throat: Oropharynx is clear and moist.  Cardiovascular: Normal rate, regular rhythm, normal heart sounds and intact distal pulses.   Pulmonary/Chest: Effort normal and breath sounds normal. No respiratory distress. She has no wheezes.  Neurological: She is alert and oriented to person, place, and time.  Skin: Skin is warm and dry. Capillary refill takes less than 2 seconds.    Assessment & Plan:   See Encounters Tab for problem based charting.  Patient seen with Dr. Cleda DaubE. Hoffman

## 2017-05-22 NOTE — Assessment & Plan Note (Signed)
Pt requesting lab work for [employer] incentive. Includes information on lipids, glucose, though a form is missing that may provide further details. She was provided a fax number to the clinic to send any other necessary documents.  --BMP, TSH, Lipid panel, Hemoglobin A1c. Will fax results at patient request in the near future.

## 2017-05-22 NOTE — Assessment & Plan Note (Signed)
Today BP is 144/59, making two consecutive visits with elevated BP. We discussed the recommendation for treatment of BP, but she wishes to continue lifestyle modifications to decrease her BP rather than medications. She has increased her walking recently and intends to continue to lose weight. Consider further discussions if her BP continues to be elevated after more time with attempted changes.

## 2017-05-23 LAB — LIPID PANEL
CHOL/HDL RATIO: 3.2 ratio (ref 0.0–4.4)
Cholesterol, Total: 207 mg/dL — ABNORMAL HIGH (ref 100–199)
HDL: 64 mg/dL (ref >39)
LDL Calculated: 123 mg/dL — ABNORMAL HIGH (ref 0–99)
Triglycerides: 98 mg/dL (ref 0–149)
VLDL Cholesterol Cal: 20 mg/dL (ref 5–40)

## 2017-05-23 LAB — BMP8+ANION GAP
Anion Gap: 18 mmol/L (ref 10.0–18.0)
BUN / CREAT RATIO: 16 (ref 12–28)
BUN: 14 mg/dL (ref 8–27)
CALCIUM: 10 mg/dL (ref 8.7–10.3)
CO2: 22 mmol/L (ref 20–29)
Chloride: 102 mmol/L (ref 96–106)
Creatinine, Ser: 0.86 mg/dL (ref 0.57–1.00)
GFR calc non Af Amer: 73 mL/min/{1.73_m2} (ref >59)
GFR, EST AFRICAN AMERICAN: 84 mL/min/{1.73_m2} (ref >59)
Glucose: 72 mg/dL (ref 65–99)
POTASSIUM: 4 mmol/L (ref 3.5–5.2)
SODIUM: 142 mmol/L (ref 134–144)

## 2017-05-23 LAB — TSH: TSH: 3.61 u[IU]/mL (ref 0.450–4.500)

## 2017-05-23 LAB — HEMOGLOBIN A1C
ESTIMATED AVERAGE GLUCOSE: 120 mg/dL
Hgb A1c MFr Bld: 5.8 % — ABNORMAL HIGH (ref 4.8–5.6)

## 2017-05-23 NOTE — Progress Notes (Signed)
Internal Medicine Clinic Attending  I saw and evaluated the patient.  I personally confirmed the key portions of the history and exam documented by Dr. Anthonette LegatoHarden and I reviewed pertinent patient test results.  The assessment, diagnosis, and plan were formulated together and I agree with the documentation in the resident's note.  I discussed with Gavin PoundDeborah that elevated blood pressure can lead to multiple health problems in the furture.  I applauded her efforts for lifestyle changes but encouraged her to return in 3 months and if still elevated to start BP medication.

## 2017-07-05 ENCOUNTER — Telehealth: Payer: Self-pay | Admitting: Internal Medicine

## 2017-07-05 NOTE — Telephone Encounter (Signed)
Called and spoke with the patient.  Per patient's request faxed the form to [phone] her Work Number.

## 2017-07-11 ENCOUNTER — Encounter: Payer: Self-pay | Admitting: Internal Medicine

## 2017-07-11 ENCOUNTER — Other Ambulatory Visit: Payer: Self-pay | Admitting: *Deleted

## 2017-07-12 MED ORDER — MONTELUKAST SODIUM 10 MG PO TABS: 10.0000 mg | ORAL_TABLET | Freq: Every day | ORAL | 1 refills | Status: DC

## 2017-10-12 ENCOUNTER — Other Ambulatory Visit: Payer: Self-pay | Admitting: *Deleted

## 2017-11-08 ENCOUNTER — Encounter: Payer: Self-pay | Admitting: Internal Medicine

## 2017-11-09 ENCOUNTER — Other Ambulatory Visit: Payer: Self-pay | Admitting: Internal Medicine

## 2017-11-09 MED ORDER — MONTELUKAST SODIUM 10 MG PO TABS: 10.0000 mg | ORAL_TABLET | Freq: Every day | ORAL | 1 refills | Status: AC

## 2018-06-20 ENCOUNTER — Encounter: Payer: Self-pay | Admitting: Internal Medicine

## 2018-07-09 ENCOUNTER — Telehealth: Payer: Self-pay | Admitting: *Deleted

## 2018-07-09 NOTE — Telephone Encounter (Signed)
Called pt - first, she asked if we had received her records from [facility]; looking under "Media" , we had not received any records. Pt stated it as been over a year,we do not have her records; she's concern about continuity of care and the office's ability to provide the care.Stated she had asked the doctor at prior visit if we had gotten her records but no definite answer. Stated she no longer works in [employer]; considering finding a doctor closer to home in [place]. Stated she will consider scheduling an appt after if we can get her records from [facility]. I asked Chilon if she can f/u.

## 2018-07-09 NOTE — Telephone Encounter (Signed)
We received records - pt called and informed. She decided to schedule an appt - call transferred to front office.

## 2018-08-22 ENCOUNTER — Encounter: Payer: Self-pay | Admitting: *Deleted

## 2018-09-09 ENCOUNTER — Encounter: Payer: BLUE CROSS/BLUE SHIELD | Admitting: Internal Medicine

## 2018-09-23 ENCOUNTER — Encounter: Payer: BLUE CROSS/BLUE SHIELD | Admitting: Internal Medicine

## 2018-11-11 ENCOUNTER — Encounter: Payer: BLUE CROSS/BLUE SHIELD | Admitting: Internal Medicine
